# Patient Record
Sex: Female | Born: 1995 | Hispanic: Yes | Marital: Single | Smoking: Current every day smoker
Health system: Southern US, Community
[De-identification: ages and names within clinical notes are randomized; demographics above are authoritative.]

---
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
## 2015-01-19 ENCOUNTER — Inpatient Hospital Stay (HOSPITAL_COMMUNITY)
Admission: AD | Admit: 2015-01-19 | Discharge: 2015-01-19 | Discharge status: Against Medical Advice | Payer: Medicaid Other | Source: Ambulatory Visit | Attending: Obstetrics & Gynecology | Admitting: Obstetrics & Gynecology

## 2015-01-19 DIAGNOSIS — Z5320 Procedure and treatment not carried out because of patient's decision for unspecified reasons: Secondary | ICD-10-CM | POA: Diagnosis not present

## 2015-01-19 DIAGNOSIS — R109 Unspecified abdominal pain: Secondary | ICD-10-CM | POA: Diagnosis present

## 2015-01-19 LAB — URINALYSIS, ROUTINE W REFLEX MICROSCOPIC
Bilirubin Urine: NEGATIVE
Glucose, UA: NEGATIVE mg/dL
HGB URINE DIPSTICK: NEGATIVE
Ketones, ur: NEGATIVE mg/dL
Leukocytes, UA: NEGATIVE
Nitrite: NEGATIVE
PH: 6 (ref 5.0–8.0)
Protein, ur: NEGATIVE mg/dL
Urobilinogen, UA: 1 mg/dL (ref 0.0–1.0)

## 2015-01-19 NOTE — MAU Note (Signed)
Not in lobby

## 2015-01-19 NOTE — MAU Note (Signed)
Patient presents with possible pregnancy and c/o abdominal pain X 2 weeks. Denies bleeding or discharge.

## 2015-01-20 ENCOUNTER — Emergency Department (HOSPITAL_BASED_OUTPATIENT_CLINIC_OR_DEPARTMENT_OTHER)
Admission: EM | Admit: 2015-01-20 | Discharge: 2015-01-21 | Disposition: A | Discharge status: Home / Self Care | Payer: Medicaid Other | Attending: Emergency Medicine | Admitting: Emergency Medicine

## 2015-01-20 ENCOUNTER — Encounter (HOSPITAL_BASED_OUTPATIENT_CLINIC_OR_DEPARTMENT_OTHER): Payer: Self-pay | Admitting: *Deleted

## 2015-01-20 DIAGNOSIS — R109 Unspecified abdominal pain: Secondary | ICD-10-CM

## 2015-01-20 DIAGNOSIS — Z3A13 13 weeks gestation of pregnancy: Secondary | ICD-10-CM | POA: Insufficient documentation

## 2015-01-20 DIAGNOSIS — O26899 Other specified pregnancy related conditions, unspecified trimester: Secondary | ICD-10-CM

## 2015-01-20 DIAGNOSIS — O9989 Other specified diseases and conditions complicating pregnancy, childbirth and the puerperium: Secondary | ICD-10-CM | POA: Diagnosis present

## 2015-01-20 DIAGNOSIS — Z3491 Encounter for supervision of normal pregnancy, unspecified, first trimester: Secondary | ICD-10-CM

## 2015-01-20 DIAGNOSIS — O2341 Unspecified infection of urinary tract in pregnancy, first trimester: Secondary | ICD-10-CM | POA: Insufficient documentation

## 2015-01-20 LAB — URINALYSIS, ROUTINE W REFLEX MICROSCOPIC
Bilirubin Urine: NEGATIVE
Glucose, UA: NEGATIVE mg/dL
Hgb urine dipstick: NEGATIVE
Ketones, ur: NEGATIVE mg/dL
Nitrite: NEGATIVE
PH: 6.5 (ref 5.0–8.0)
Protein, ur: NEGATIVE mg/dL
SPECIFIC GRAVITY, URINE: 1.03 (ref 1.005–1.030)
UROBILINOGEN UA: 1 mg/dL (ref 0.0–1.0)

## 2015-01-20 LAB — URINE MICROSCOPIC-ADD ON

## 2015-01-20 LAB — PREGNANCY, URINE: PREG TEST UR: POSITIVE — AB

## 2015-01-20 NOTE — ED Notes (Signed)
Pt. Reports abd. Pain in the lower L quadrant that started approx. 2 wks ago.  Pain has been off and on and tonight the Pt. Reports she started vomiting 2 hours ago... Pt. Reports she vomited 1 time.  No reports of diarrhea.

## 2015-01-21 ENCOUNTER — Emergency Department (HOSPITAL_BASED_OUTPATIENT_CLINIC_OR_DEPARTMENT_OTHER): Payer: Medicaid Other

## 2015-01-21 MED ORDER — PRENATAL COMPLETE 14-0.4 MG PO TABS: 1.0000 | ORAL_TABLET | Freq: Every day | ORAL | Status: AC

## 2015-01-21 MED ORDER — NITROFURANTOIN MONOHYD MACRO 100 MG PO CAPS
100.0000 mg | ORAL_CAPSULE | Freq: Once | ORAL | Status: AC
Start: 2015-01-21 — End: 2015-01-21
  Administered 2015-01-21: 100 mg via ORAL
  Filled 2015-01-21: qty 1

## 2015-01-21 MED ORDER — NITROFURANTOIN MONOHYD MACRO 100 MG PO CAPS: 100.0000 mg | ORAL_CAPSULE | Freq: Two times a day (BID) | ORAL | Status: AC

## 2015-01-21 NOTE — ED Provider Notes (Signed)
CSN: [id number]     Arrival date & time 01/20/15  2321 History   First MD Initiated Contact with Patient 01/21/15 0123     Chief Complaint  Patient presents with  . Abdominal Pain     (Consider location/radiation/quality/duration/timing/severity/associated sxs/prior Treatment) HPI  This is an 19 year old female who has had a positive pregnancy test at home but does not know how far along she is. She is here with a two-week history of left suprapubic pain. The pain is intermittent and sharp. It is mild to moderate at its worst. There is no specific mitigating or exacerbating factor. She denies fever, chills, nausea, vomiting except for one instance, diarrhea, vaginal bleeding or discharge, dysuria or hematuria.  History reviewed. No pertinent past medical history. History reviewed. No pertinent past surgical history. No family history on file. History  Substance Use Topics  . Smoking status: Never Smoker   . Smokeless tobacco: Not on file  . Alcohol Use: No   OB History    No data available     Review of Systems  All other systems reviewed and are negative.   Allergies  Review of patient's allergies indicates no known allergies.  Home Medications   Prior to Admission medications   Not on File   BP 102/58 mmHg  Pulse 75  Temp(Src) 98.5 F (36.9 C) (Oral)  Resp 18  Ht  (1.626 m)  Wt 117 lb (53.071 kg)  BMI 20.07 kg/m2  SpO2 100%  LMP 11/22/2014   Physical Exam  General: Well-developed, well-nourished female in no acute distress; appearance consistent with age of record HENT: normocephalic; atraumatic Eyes: pupils equal, round and reactive to light; extraocular muscles intact Neck: supple Heart: regular rate and rhythm Lungs: clear to auscultation bilaterally Abdomen: soft; nondistended; nontender; no masses or hepatosplenomegaly; bowel sounds present Extremities: No deformity; full range of motion; pulses normal Neurologic: Awake, alert and oriented; motor  function intact in all extremities and symmetric; no facial droop Skin: Warm and dry Psychiatric: Normal mood and affect    ED Course  Procedures (including critical care time)   MDM   Nursing notes and vitals signs, including pulse oximetry, reviewed.  Summary of this visit's results, reviewed by myself:  Labs:  Results for orders placed or performed during the hospital encounter of 01/20/15 (from the past 24 hour(s))  Urinalysis, Routine w reflex microscopic (not at [facility])     Status: Abnormal   Collection Time: 01/20/15 11:25 PM  Result Value Ref Range   Color, Urine YELLOW YELLOW   APPearance CLOUDY (A) CLEAR   Specific Gravity, Urine 1.030 1.005 - 1.030   pH 6.5 5.0 - 8.0   Glucose, UA NEGATIVE NEGATIVE mg/dL   Hgb urine dipstick NEGATIVE NEGATIVE   Bilirubin Urine NEGATIVE NEGATIVE   Ketones, ur NEGATIVE NEGATIVE mg/dL   Protein, ur NEGATIVE NEGATIVE mg/dL   Urobilinogen, UA 1.0 0.0 - 1.0 mg/dL   Nitrite NEGATIVE NEGATIVE   Leukocytes, UA MODERATE (A) NEGATIVE  Pregnancy, urine     Status: Abnormal   Collection Time: 01/20/15 11:25 PM  Result Value Ref Range   Preg Test, Ur POSITIVE (A) NEGATIVE  Urine microscopic-add on     Status: Abnormal   Collection Time: 01/20/15 11:25 PM  Result Value Ref Range   Squamous Epithelial / LPF MANY (A) RARE   WBC, UA 7-10 <3 WBC/hpf   RBC / HPF 0-2 <3 RBC/hpf   Bacteria, UA MANY (A) RARE   Urine-Other MUCOUS PRESENT  Imaging Studies: [place] Ob Comp Less 14 Wks  01/21/2015   CLINICAL DATA:  Left lower quadrant pain and nausea. Estimated gestational age by LMP is 8 weeks 4 days. Quantitative beta HCG is not obtained.  EXAM: OBSTETRIC <14 WK ULTRASOUND  TECHNIQUE: Transabdominal ultrasound was performed for evaluation of the gestation as well as the maternal uterus and adnexal regions.  COMPARISON:  None.  FINDINGS: Intrauterine gestational sac: A single intrauterine pregnancy is identified.  Yolk sac: Yolk sac is not visualized  consistent with gestational age.  Embryo:  Fetal pole is identified.  Cardiac Activity: Fetal cardiac activity is observed.  Heart Rate: 162 bpm  CRL:   39  mm   10 w 6 d  Biparietal diameter:  19 mm 12 w 6 d  US EDC based on combined measurements: 11 weeks 6 days  Maternal uterus/adnexae: No myometrial mass lesions identified. No subchorionic hemorrhage. Both ovaries are identified and appear normal. No abnormal adnexal masses. No free pelvic fluid.  IMPRESSION: A single intrauterine pregnancy is identified. Based on combination of crown-rump length and biparietal diameter, estimated gestational age by ultrasound is 11 weeks 6 days. No acute complication is suggested.   Electronically Signed   By: Burman NievesWilliam  Stevens M.D.   On: 01/21/2015 01:16      Paula LibraJohn Gustaf Mccarter, MD 01/21/15 0127

## 2015-01-21 NOTE — ED Notes (Signed)
C/o abd pain off and on x 2 weeks,  Vomited x 1  Denies burning w urination,  No dc,  No diarrhea

## 2015-01-22 LAB — URINE CULTURE

## 2016-02-01 IMAGING — US US OB COMP LESS 14 WK
1 series · 14 of 14 positions shown · non-contrast
Comparison: None.

CLINICAL DATA: Left lower quadrant pain and nausea. Estimated
gestational age by LMP is 8 weeks 4 days. Quantitative beta HCG is
not obtained.

EXAM:
OBSTETRIC <14 WK ULTRASOUND
TECHNIQUE: Transabdominal ultrasound was performed for evaluation of the
gestation as well as the maternal uterus and adnexal regions.

[Series 1: us ob comp less 14 wk · 0.18mm/px · 14 acquisitions, 14 frames shown]
[im 1/14]
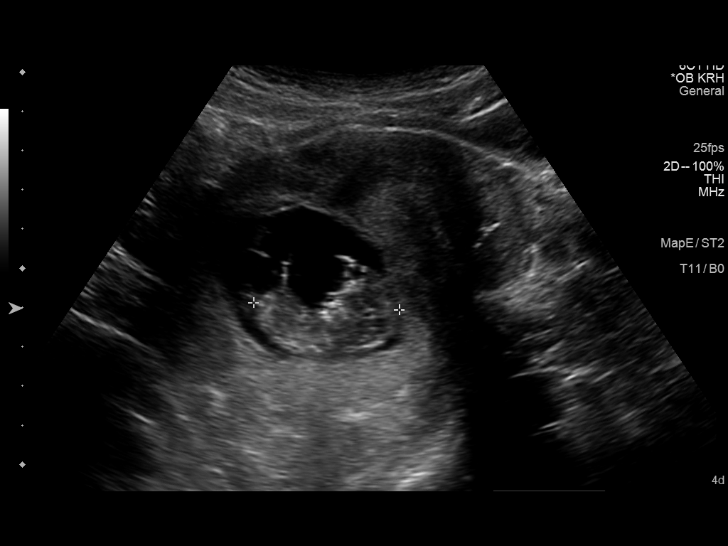
[im 2/14]
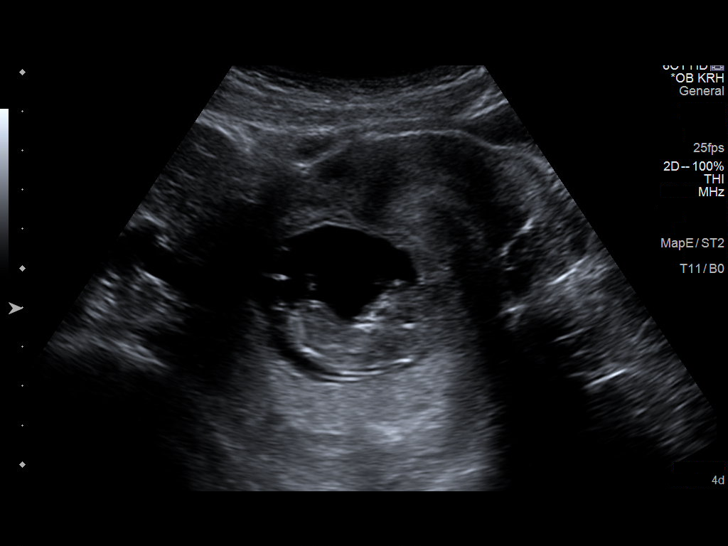
[im 3/14]
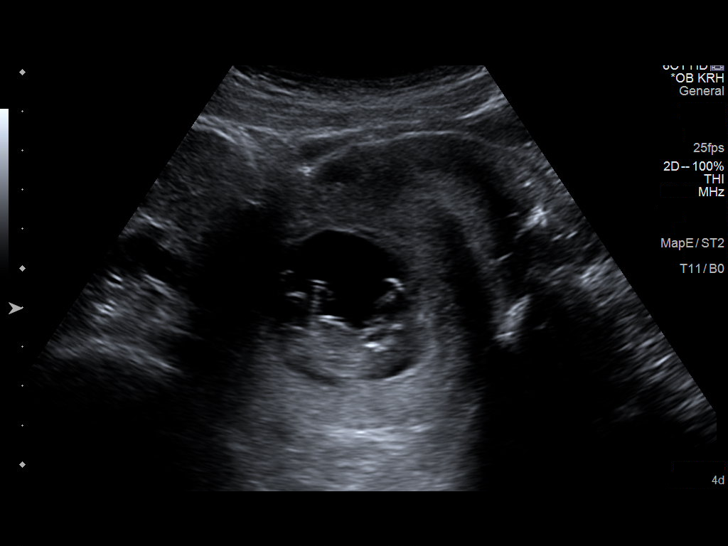
[im 4/14]
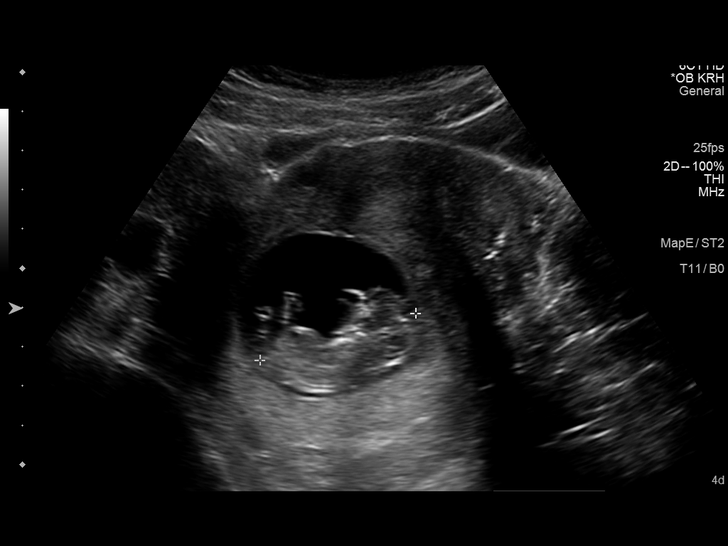
[im 5/14]
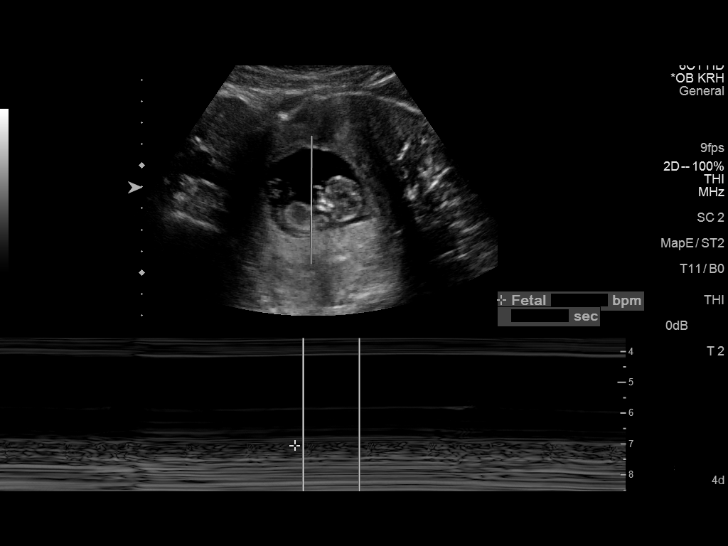
[im 6/14]
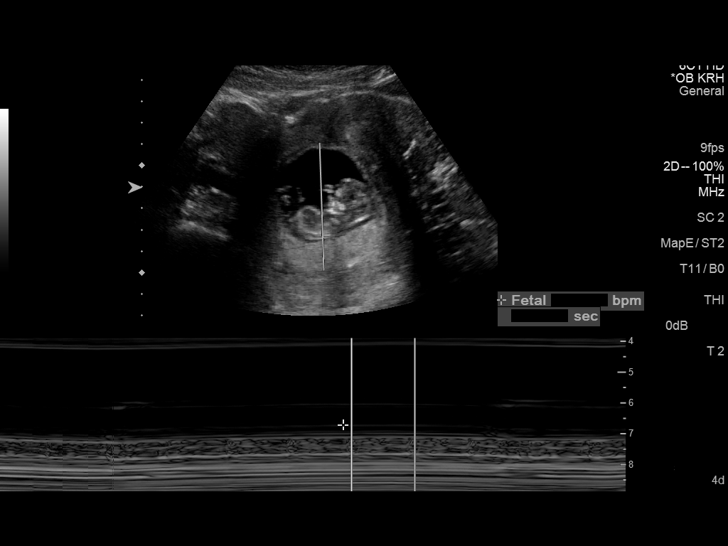
[im 7/14]
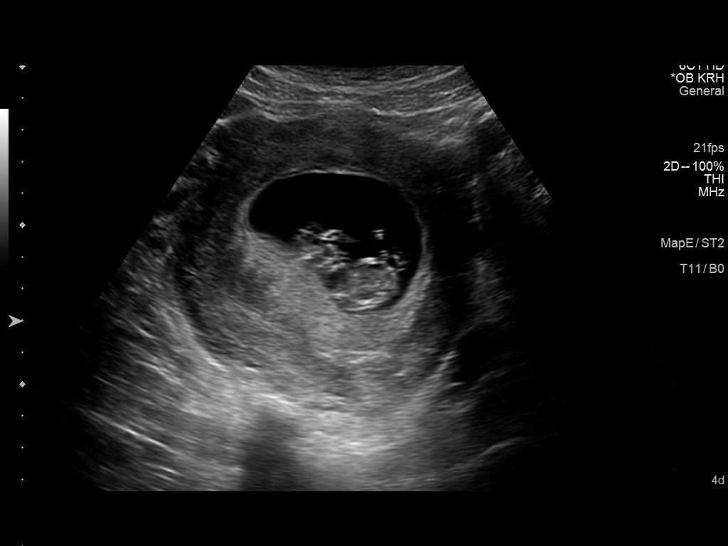
[im 8/14]
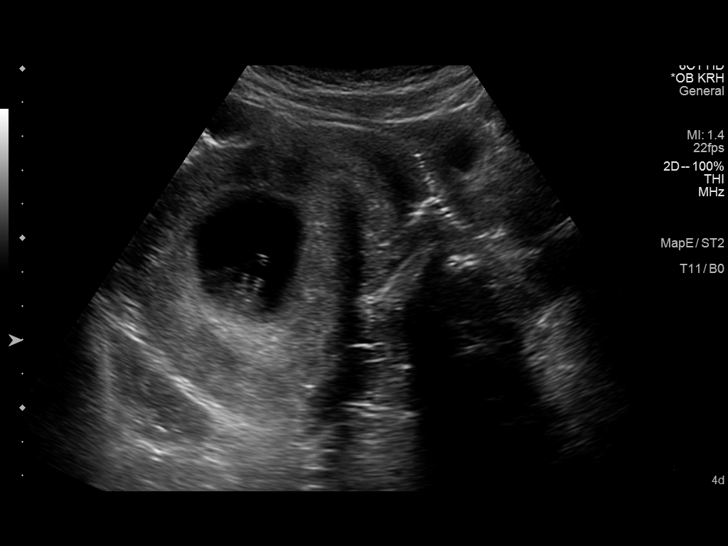
[im 9/14]
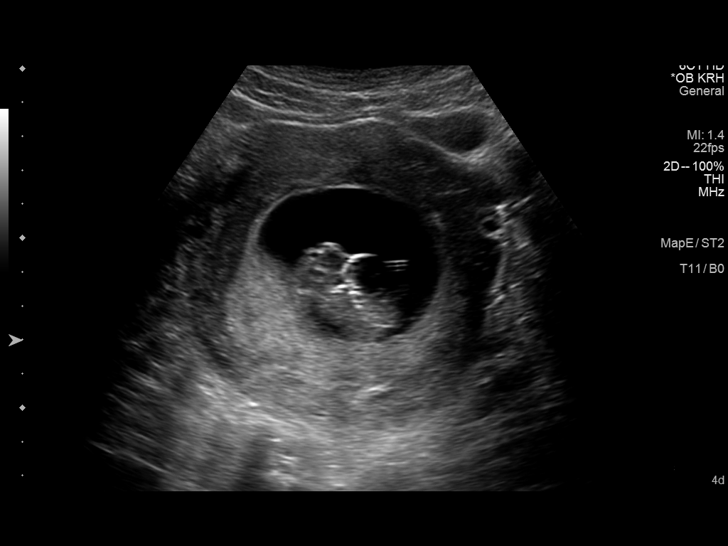
[im 10/14]
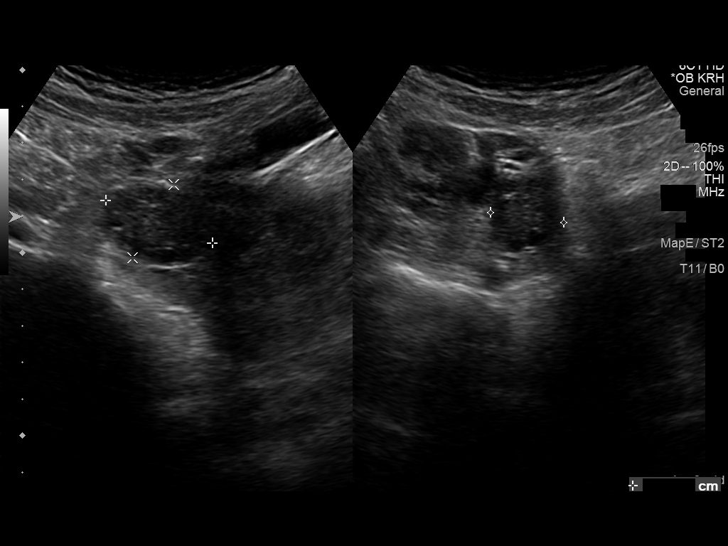
[im 11/14]
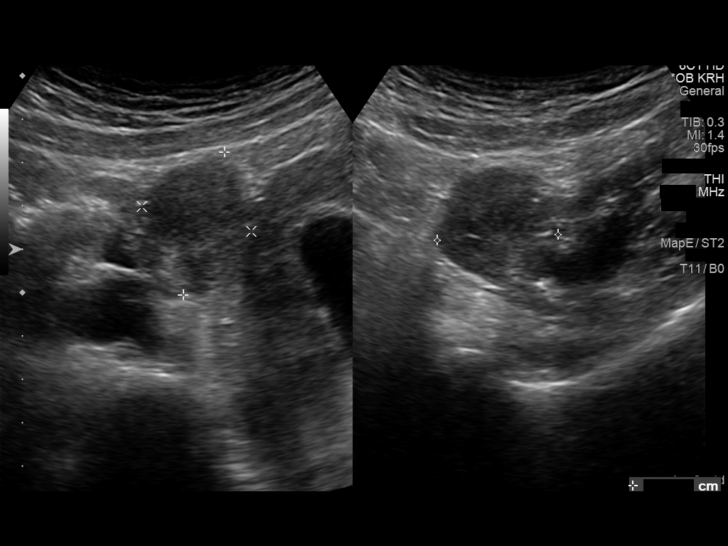
[im 12/14]
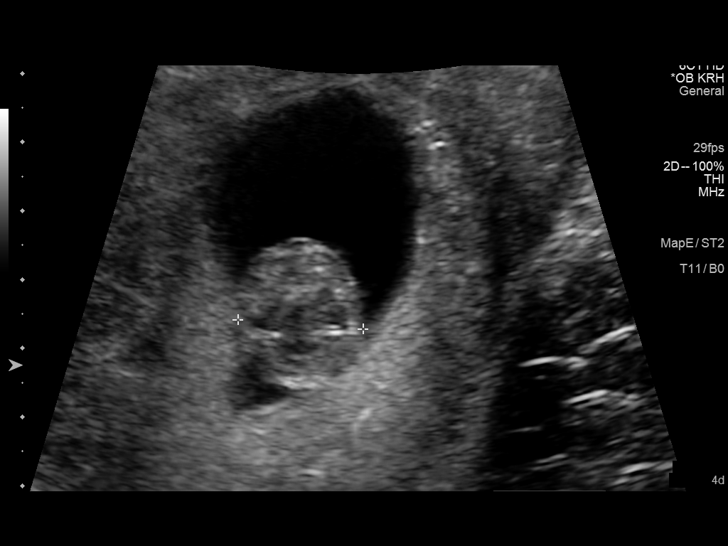
[im 13/14]
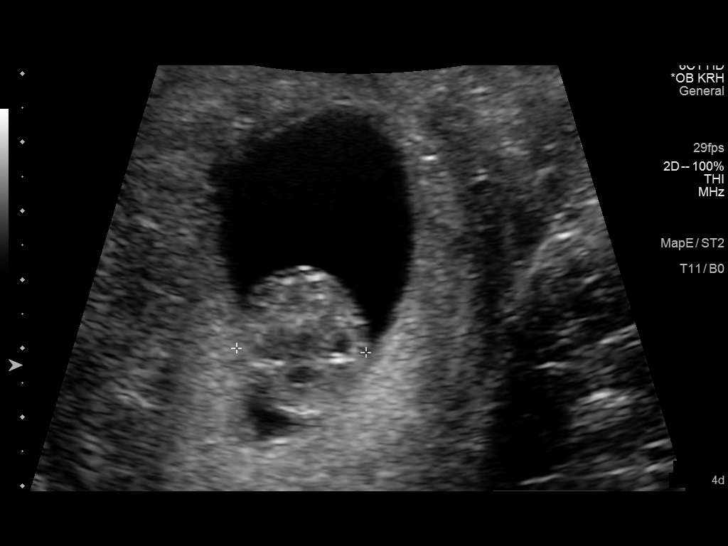
[im 14/14]
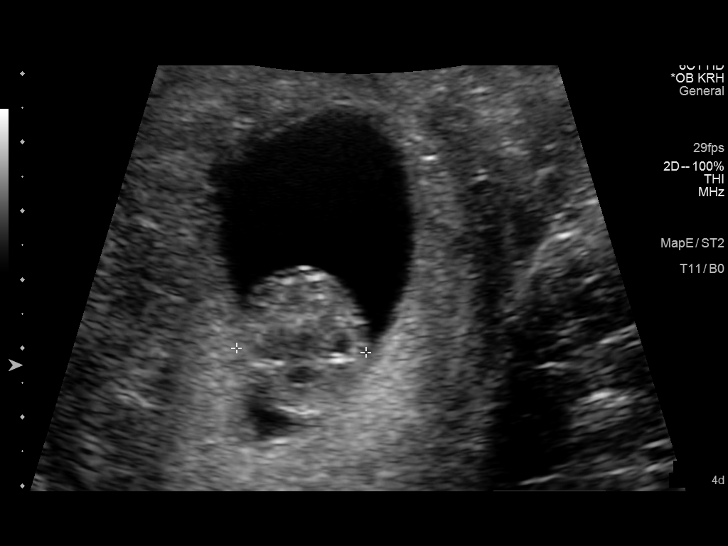

[14 of 14 positions shown; findings below may reference images not displayed]

FINDINGS: Intrauterine gestational sac: A single intrauterine pregnancy is
identified.

Yolk sac: Yolk sac is not visualized consistent with gestational
age.

Embryo:  Fetal pole is identified.

Cardiac Activity: Fetal cardiac activity is observed.

Heart Rate: 162 bpm

CRL:   39  mm   10 w 6 d

Biparietal diameter:  19 mm 12 w 6 d

US EDC based on combined measurements: 11 weeks 6 days

Maternal uterus/adnexae: No myometrial mass lesions identified. No
subchorionic hemorrhage. Both ovaries are identified and appear
normal. No abnormal adnexal masses. No free pelvic fluid.
IMPRESSION: A single intrauterine pregnancy is identified. Based on combination
of crown-rump length and biparietal diameter, estimated gestational
age by ultrasound is 11 weeks 6 days. No acute complication is
suggested.

## 2016-08-20 ENCOUNTER — Emergency Department (HOSPITAL_BASED_OUTPATIENT_CLINIC_OR_DEPARTMENT_OTHER)
Admission: EM | Admit: 2016-08-20 | Discharge: 2016-08-20 | Disposition: A | Discharge status: Home / Self Care | Payer: Medicaid Other | Attending: Dermatology | Admitting: Dermatology

## 2016-08-20 ENCOUNTER — Encounter (HOSPITAL_BASED_OUTPATIENT_CLINIC_OR_DEPARTMENT_OTHER): Payer: Self-pay | Admitting: *Deleted

## 2016-08-20 DIAGNOSIS — M545 Low back pain: Secondary | ICD-10-CM | POA: Insufficient documentation

## 2016-08-20 DIAGNOSIS — R1110 Vomiting, unspecified: Secondary | ICD-10-CM | POA: Insufficient documentation

## 2016-08-20 DIAGNOSIS — Z5321 Procedure and treatment not carried out due to patient leaving prior to being seen by health care provider: Secondary | ICD-10-CM | POA: Diagnosis not present

## 2016-08-20 DIAGNOSIS — Z79899 Other long term (current) drug therapy: Secondary | ICD-10-CM | POA: Insufficient documentation

## 2016-08-20 LAB — URINALYSIS, ROUTINE W REFLEX MICROSCOPIC
BILIRUBIN URINE: NEGATIVE
GLUCOSE, UA: NEGATIVE mg/dL
Ketones, ur: 15 mg/dL — AB
Nitrite: NEGATIVE
Protein, ur: 100 mg/dL — AB
SPECIFIC GRAVITY, URINE: 1.037 — AB (ref 1.005–1.030)
pH: 6 (ref 5.0–8.0)

## 2016-08-20 LAB — URINALYSIS, MICROSCOPIC (REFLEX)

## 2016-08-20 LAB — PREGNANCY, URINE: Preg Test, Ur: NEGATIVE

## 2016-08-20 MED ORDER — ONDANSETRON 4 MG PO TBDP
4.0000 mg | ORAL_TABLET | Freq: Once | ORAL | Status: AC
  Administered 2016-08-20: 4 mg via ORAL
  Filled 2016-08-20: qty 1

## 2016-08-20 NOTE — ED Notes (Signed)
Pt wandering how long it was going to be before the doctor comes in. Pt made aware that it's going to be awhile.

## 2016-08-20 NOTE — ED Notes (Signed)
Pt was triaged by this RN.

## 2016-08-20 NOTE — ED Triage Notes (Signed)
Pt states she ate [employer] at 2200, then around MN she began vomiting. Vomited x 3 since. Also c/o low back pain. Denies other s/s.

## 2016-09-02 ENCOUNTER — Emergency Department (HOSPITAL_BASED_OUTPATIENT_CLINIC_OR_DEPARTMENT_OTHER)
Admission: EM | Admit: 2016-09-02 | Discharge: 2016-09-02 | Disposition: A | Discharge status: Home / Self Care | Payer: Medicaid Other | Attending: Emergency Medicine | Admitting: Emergency Medicine

## 2016-09-02 ENCOUNTER — Encounter (HOSPITAL_BASED_OUTPATIENT_CLINIC_OR_DEPARTMENT_OTHER): Payer: Self-pay | Admitting: Emergency Medicine

## 2016-09-02 DIAGNOSIS — R1032 Left lower quadrant pain: Secondary | ICD-10-CM | POA: Insufficient documentation

## 2016-09-02 DIAGNOSIS — R112 Nausea with vomiting, unspecified: Secondary | ICD-10-CM

## 2016-09-02 DIAGNOSIS — R1031 Right lower quadrant pain: Secondary | ICD-10-CM | POA: Diagnosis present

## 2016-09-02 LAB — URINALYSIS, ROUTINE W REFLEX MICROSCOPIC
Bilirubin Urine: NEGATIVE
GLUCOSE, UA: NEGATIVE mg/dL
KETONES UR: NEGATIVE mg/dL
Nitrite: NEGATIVE
Protein, ur: NEGATIVE mg/dL
Specific Gravity, Urine: 1.024 (ref 1.005–1.030)
pH: 6 (ref 5.0–8.0)

## 2016-09-02 LAB — URINALYSIS, MICROSCOPIC (REFLEX)

## 2016-09-02 LAB — PREGNANCY, URINE: PREG TEST UR: NEGATIVE

## 2016-09-02 MED ORDER — ONDANSETRON 4 MG PO TBDP: 4.0000 mg | ORAL_TABLET | Freq: Once | ORAL | Status: DC

## 2016-09-02 MED ORDER — DICYCLOMINE HCL 20 MG PO TABS: 20.0000 mg | ORAL_TABLET | Freq: Three times a day (TID) | ORAL | 0 refills | Status: AC | PRN

## 2016-09-02 MED ORDER — ONDANSETRON 4 MG PO TBDP: 4.0000 mg | ORAL_TABLET | Freq: Three times a day (TID) | ORAL | 0 refills | Status: AC | PRN

## 2016-09-02 NOTE — ED Provider Notes (Signed)
By signing my name below, I, Modena Jansky, attest that this documentation has been prepared under the direction and in the presence of Deetta Siegmann N Janah Mcculloh, DO. Electronically Signed: Modena Jansky, Scribe. 09/02/2016. 11:15 PM.  TIME SEEN: 11:15 PM  CHIEF COMPLAINT: Abdominal pain  HPI:  HPI Comments: Michele Barber is a 21 y.o. female who presents to the Emergency Department complaining of constant moderate lower abdominal pain that started today. She states her pain resolved after her 3rd episode of associated vomiting. She describes the pain before as a cramping sensation. She is currently on her menstrual cycle. She denies any hx of abdominal surgeries, recent international travel, diarrhea, fever, chills, blood in stool, melena, dysuria, hematuria, or vaginal bleeding/discharge. Reports she is feeling much better.   ROS: See HPI Constitutional: no fever  Eyes: no drainage  ENT: no runny nose   Cardiovascular:  no chest pain  Resp: no SOB  GI: +vomiting +abd pain GU: no dysuria Integumentary: no rash  Allergy: no hives  Musculoskeletal: no leg swelling  Neurological: no slurred speech ROS otherwise negative  PAST MEDICAL HISTORY/PAST SURGICAL HISTORY:  History reviewed. No pertinent past medical history.  MEDICATIONS:  Prior to Admission medications   Medication Sig Start Date End Date Taking? Authorizing Provider  nitrofurantoin, macrocrystal-monohydrate, (MACROBID) 100 MG capsule Take 1 capsule (100 mg total) by mouth 2 (two) times daily. X 7 days 01/21/15   Paula Libra, MD  Prenatal Vit-Fe Fumarate-FA (PRENATAL COMPLETE) 14-0.4 MG TABS Take 1 tablet by mouth daily. 01/21/15   Paula Libra, MD    ALLERGIES:  No Known Allergies  SOCIAL HISTORY:  Social History  Substance Use Topics  . Smoking status: Never Smoker  . Smokeless tobacco: Never Used  . Alcohol use No    FAMILY HISTORY: History reviewed. No pertinent family history.  EXAM: BP 106/60 (BP Location: Left Arm)    Pulse 75   Temp 98.4 F (36.9 C) (Oral)   Resp 18   Ht 5\' 3"  (1.6 m)   Wt 115 lb (52.2 kg)   LMP 08/20/2016 (Exact Date)   SpO2 100%   BMI 20.37 kg/m  CONSTITUTIONAL: Alert and oriented and responds appropriately to questions. Well-appearing; well-nourished and afebrile, nontoxic, smiling, well-hydrated HEAD: Normocephalic EYES: Conjunctivae clear, PERRL, EOMI ENT: normal nose; no rhinorrhea; moist mucous membranes NECK: Supple, no meningismus, no nuchal rigidity, no LAD  CARD: RRR; S1 and S2 appreciated; no murmurs, no clicks, no rubs, no gallops RESP: Normal chest excursion without splinting or tachypnea; breath sounds clear and equal bilaterally; no wheezes, no rhonchi, no rales, no hypoxia or respiratory distress, speaking full sentences ABD/GI: Normal bowel sounds; non-distended; soft, non-tender, no rebound, no guarding, no peritoneal signs, no hepatosplenomegaly, no tenderness at McBurney's point BACK:  The back appears normal and is non-tender to palpation, there is no CVA tenderness EXT: Normal ROM in all joints; non-tender to palpation; no edema; normal capillary refill; no cyanosis, no calf tenderness or swelling    SKIN: Normal color for age and race; warm; no rash NEURO: Moves all extremities equally, normal gait PSYCH: The patient's mood and manner are appropriate. Grooming and personal hygiene are appropriate.  MEDICAL DECISION MAKING: Patient here with likely viral gastroenteritis. Had a crampy abdominal sensation diffusely throughout her abdomen prior to vomiting 3 times. No fevers or diarrhea. Abdominal pain is now completely gone. Her bowel exam is benign. She appears well-hydrated and is nontoxic. We'll treat with Zofran but feel she can be discharged home. We'll give  prescriptions for Bentyl, Zofran. Discussed with her that she may develop fever, diarrhea have recommended ibuprofen, Tylenol, Imodium as needed. Discussed return precautions. Urine shows blood but again  she is on her menstrual cycle without urinary symptoms. Pregnancy test is negative. She is comfortable with this plan. I do not feel she has cholecystitis, appendicitis, colitis, diverticulitis, bowel obstruction given her very benign abdominal exam. I do not feel she needs labs or emergent imaging.  At this time, I do not feel there is any life-threatening condition present. I have reviewed and discussed all results (EKG, imaging, lab, urine as appropriate) and exam findings with patient/family. I have reviewed nursing notes and appropriate previous records.  I feel the patient is safe to be discharged home without further emergent workup and can continue workup as an outpatient as needed. Discussed usual and customary return precautions. Patient/family verbalize understanding and are comfortable with this plan.  Outpatient follow-up has been provided. All questions have been answered.   I personally performed the services described in this documentation, which was scribed in my presence. The recorded information has been reviewed and is accurate.     Layla MawKristen N Armoni Depass, DO 09/03/16 [phone]

## 2016-09-02 NOTE — ED Triage Notes (Signed)
Pt reports bilateral lower abd pain and emesis x2 starting today, no diarrhea or urinary symptoms.  Pt alert and oriented.

## 2016-09-02 NOTE — Discharge Instructions (Signed)
If you develop fever, you may use Tylenol 1000 mg every 6 hours and alternate this with ibuprofen 800 mg every 8 hours. If you develop diarrhea, you may use over-the-counter Imodium. Please eat a bland diet for the next 2-3 days. I suspect this is likely a viral illness causing your symptoms. If you began having worsening, severe abdominal pain that is persistent, vomiting and cannot stop, blood in your stool, please return to the hospital.   To find a primary care or specialty doctor please call [phone] or [phone] to access "[place] Find a Doctor Service."  You may also go on the [facility] website at [email or web address]  There are also multiple [facility], [facility], [facility] practices throughout the [place] that are frequently accepting new patients. You may find a clinic that is close to your home and contact them.  [facility] -  [address] [place] [phone]   [facility] -  [address] [place] [id number] [phone]   [facility] [phone]  [place] [phone]

## 2016-09-02 NOTE — ED Notes (Signed)
Pt states she "feels fine now" and is asking to go home. RN encouraged patient to stay and be evaluated by provider.

## 2016-09-02 NOTE — ED Notes (Signed)
Water given for fluid challenge  ?

## 2016-09-02 NOTE — ED Notes (Signed)
Pt given d/c instructions as per chart. Rx x 2. Verbalizes understanding. No questions. 

## 2016-09-12 ENCOUNTER — Telehealth (HOSPITAL_BASED_OUTPATIENT_CLINIC_OR_DEPARTMENT_OTHER): Payer: Self-pay

## 2016-09-12 NOTE — Telephone Encounter (Signed)
Pt requested copy of RTW note from 09/02/16 ED visit-states employer requires "no restrictions" on note-copy of note from ED chart with no restrictions option selected given to pt

## 2017-02-18 ENCOUNTER — Encounter (HOSPITAL_BASED_OUTPATIENT_CLINIC_OR_DEPARTMENT_OTHER): Payer: Self-pay | Admitting: *Deleted

## 2017-02-18 ENCOUNTER — Emergency Department (HOSPITAL_BASED_OUTPATIENT_CLINIC_OR_DEPARTMENT_OTHER)
Admission: EM | Admit: 2017-02-18 | Discharge: 2017-02-18 | Disposition: A | Discharge status: Home / Self Care | Payer: Medicaid Other | Attending: Emergency Medicine | Admitting: Emergency Medicine

## 2017-02-18 DIAGNOSIS — Z113 Encounter for screening for infections with a predominantly sexual mode of transmission: Secondary | ICD-10-CM | POA: Diagnosis not present

## 2017-02-18 DIAGNOSIS — N760 Acute vaginitis: Secondary | ICD-10-CM | POA: Diagnosis not present

## 2017-02-18 DIAGNOSIS — N898 Other specified noninflammatory disorders of vagina: Secondary | ICD-10-CM | POA: Diagnosis present

## 2017-02-18 DIAGNOSIS — Z202 Contact with and (suspected) exposure to infections with a predominantly sexual mode of transmission: Secondary | ICD-10-CM | POA: Insufficient documentation

## 2017-02-18 DIAGNOSIS — B9689 Other specified bacterial agents as the cause of diseases classified elsewhere: Secondary | ICD-10-CM

## 2017-02-18 LAB — WET PREP, GENITAL
Sperm: NONE SEEN
TRICH WET PREP: NONE SEEN
YEAST WET PREP: NONE SEEN

## 2017-02-18 LAB — URINALYSIS, ROUTINE W REFLEX MICROSCOPIC
Bilirubin Urine: NEGATIVE
GLUCOSE, UA: NEGATIVE mg/dL
Hgb urine dipstick: NEGATIVE
KETONES UR: 15 mg/dL — AB
Leukocytes, UA: NEGATIVE
NITRITE: NEGATIVE
Protein, ur: NEGATIVE mg/dL
Specific Gravity, Urine: 1.024 (ref 1.005–1.030)
pH: 6.5 (ref 5.0–8.0)

## 2017-02-18 LAB — PREGNANCY, URINE: PREG TEST UR: NEGATIVE

## 2017-02-18 MED ORDER — AZITHROMYCIN 250 MG PO TABS
1000.0000 mg | ORAL_TABLET | Freq: Once | ORAL | Status: AC
  Administered 2017-02-18: 1000 mg via ORAL
  Filled 2017-02-18: qty 4

## 2017-02-18 MED ORDER — LIDOCAINE HCL 1 % IJ SOLN
INTRAMUSCULAR | Status: AC
  Administered 2017-02-18: 10 mL
  Filled 2017-02-18: qty 10

## 2017-02-18 MED ORDER — CEFTRIAXONE SODIUM 250 MG IJ SOLR
250.0000 mg | Freq: Once | INTRAMUSCULAR | Status: AC
  Administered 2017-02-18: 250 mg via INTRAMUSCULAR
  Filled 2017-02-18: qty 250

## 2017-02-18 MED ORDER — METRONIDAZOLE 500 MG PO TABS: 500.0000 mg | ORAL_TABLET | Freq: Two times a day (BID) | ORAL | 0 refills | Status: AC

## 2017-02-18 NOTE — ED Notes (Signed)
Patient denies pain and is resting comfortably.  

## 2017-02-18 NOTE — Discharge Instructions (Signed)
You been treated today for gonorrhea and chlamydia. We have taken samples to test for these diseases. If the samples are positive, you will be notified. You do not need to seek treatment as you have been treated today.  Take antibiotics as directed. Please take all of your antibiotics until finished.  Take Flagyl as directed.  It is very important that you do not consume any alcohol while taking this medication as it will cause you to become violently ill.  Do not have sexual intercourse for the next 7 days.  Follow-up with the referred wellness clinic for primary care evaluation. You can also follow up with her OB/GYN for further evaluation.  Return the emergency Department for any worsening pain, vaginal discharge, vaginal bleeding, fever, abdominal pain, difficulty urinating, blood in urine or any other worsening or concerning symptoms.

## 2017-02-18 NOTE — ED Triage Notes (Signed)
States she was exposed to an STD and wants to be checked. No symptoms.

## 2017-02-18 NOTE — ED Provider Notes (Signed)
[facility] Provider Note   CSN: [id number] Arrival date & time: 02/18/17  1337     History   Chief Complaint No chief complaint on file.   HPI Michele Barber is a 21 y.o. female who presents emergency Department wanting evaluation for possible STD exposure. Patient states that she had sexual intercourse with a partner on 02/14/17. She states that she was contacted by her partner stating that he was having some complaints of burning and was going to be evaluated and treated for STDs. Patient states that they did not use protection with sexual intercourse. Patient has a history of chlamydia that was successfully treated several years ago. No complications. Patient denies any fevers, vaginal discharge, dysuria, hematuria, abdominal pain, vaginal bleeding.  The history is provided by the patient.    History reviewed. No pertinent past medical history.  There are no active problems to display for this patient.   History reviewed. No pertinent surgical history.  OB History    No data available       Home Medications    Prior to Admission medications   Medication Sig Start Date End Date Taking? Authorizing Provider  dicyclomine (BENTYL) 20 MG tablet Take 1 tablet (20 mg total) by mouth every 8 (eight) hours as needed for spasms. 09/02/16   Ward, Layla MawKristen N, DO  metroNIDAZOLE (FLAGYL) 500 MG tablet Take 1 tablet (500 mg total) by mouth 2 (two) times daily. 02/18/17   Maxwell CaulLayden, Buel Molder A, PA-C  nitrofurantoin, macrocrystal-monohydrate, (MACROBID) 100 MG capsule Take 1 capsule (100 mg total) by mouth 2 (two) times daily. X 7 days 01/21/15   Molpus, John, MD  ondansetron (ZOFRAN ODT) 4 MG disintegrating tablet Take 1 tablet (4 mg total) by mouth every 8 (eight) hours as needed for nausea or vomiting. 09/02/16   Ward, Layla MawKristen N, DO  Prenatal Vit-Fe Fumarate-FA (PRENATAL COMPLETE) 14-0.4 MG TABS Take 1 tablet by mouth daily. 01/21/15   Molpus, John, MD    Family History No family  history on file.  Social History Social History  Substance Use Topics  . Smoking status: Never Smoker  . Smokeless tobacco: Never Used  . Alcohol use No     Allergies   Patient has no known allergies.   Review of Systems Review of Systems  Constitutional: Negative for fever.  Respiratory: Negative for cough and shortness of breath.   Cardiovascular: Negative for chest pain.  Gastrointestinal: Negative for abdominal pain and vomiting.  Genitourinary: Negative for dysuria, hematuria, vaginal bleeding and vaginal discharge.  Skin: Negative for rash.  Neurological: Negative for headaches.     Physical Exam Updated Vital Signs BP 118/78 (BP Location: Right Arm)   Pulse 61   Temp 98.5 F (36.9 C) (Oral)   Resp 17   Ht 5\' 3"  (1.6 m)   Wt 51.7 kg (114 lb)   LMP 01/20/2017   SpO2 100%   BMI 20.19 kg/m   Physical Exam  Constitutional: She appears well-developed and well-nourished.  Sitting comfortably on examination table  HENT:  Head: Normocephalic and atraumatic.  Eyes: Conjunctivae and EOM are normal. Right eye exhibits no discharge. Left eye exhibits no discharge. No scleral icterus.  Pulmonary/Chest: Effort normal.  Abdominal: Soft. Normal appearance. She exhibits no distension. There is no tenderness.  Genitourinary: Uterus normal. Cervix exhibits no motion tenderness and no friability. Right adnexum displays no mass and no tenderness. Left adnexum displays no mass and no tenderness. Vaginal discharge found.  Genitourinary Comments: The exam was performed  with a chaperone present. Normal external female genitalia. No rashes, lesions or open sores. There is malodorous, white discharge noted in vaginal canal. Cervix is without erythema or friability. No CMT. No adnexal masses/tenderness bilaterally.  Neurological: She is alert.  Skin: Skin is warm and dry.  Psychiatric: She has a normal mood and affect. Her speech is normal and behavior is normal.  Nursing note and  vitals reviewed.    ED Treatments / Results  Labs (all labs ordered are listed, but only abnormal results are displayed) Labs Reviewed  WET PREP, GENITAL - Abnormal; Notable for the following:       Result Value   Clue Cells Wet Prep HPF POC PRESENT (*)    WBC, Wet Prep HPF POC MANY (*)    All other components within normal limits  URINALYSIS, ROUTINE W REFLEX MICROSCOPIC - Abnormal; Notable for the following:    Ketones, ur 15 (*)    All other components within normal limits  PREGNANCY, URINE  GC/CHLAMYDIA PROBE AMP ([place]) NOT AT [facility]    EKG  EKG Interpretation None       Radiology No results found.  Procedures Procedures (including critical care time)  Medications Ordered in ED Medications  cefTRIAXone (ROCEPHIN) injection 250 mg (250 mg Intramuscular Given 02/18/17 1559)  azithromycin (ZITHROMAX) tablet 1,000 mg (1,000 mg Oral Given 02/18/17 1558)  lidocaine (XYLOCAINE) 1 % (with pres) injection (10 mLs  Given 02/18/17 1559)     Initial Impression / Assessment and Plan / ED Course  I have reviewed the triage vital signs and the nursing notes.  Pertinent labs & imaging results that were available during my care of the patient were reviewed by me and considered in my medical decision making (see chart for details).     21 year old female who presents for concern about STD. Patient reportedly had sexual intercourse with a partner on 02/1812. She was notified by the partner today stating that he was symptomatic and was going to be treated for STDs. Patient denies any symptoms but would like to be evaluated. Patient is afebrile, non-toxic appearing, sitting comfortably on examination table. Vital signs reviewed and stable. Plan to obtain pelvic, UA, urine pregnancy.  Urine pregnancy negative. UA without any signs of acute infection. Pelvic as documented above. Pelvic exam is not concerning for PID. There was some white, malodorous discharge, concerning for BV. Wet  prep and GC/chlamydia sent. Since patient is concerned about exposure, I discussed with her regarding being treated for the GC/chlamydia in the department today. Patient wishes to be treated at this time.  Wet prep reviewed. Is positive for clue cells. Will plan to treat for BV. Discussed results with patient. Instructed patient to follow-up with the wellness Center or her OB/GYN. Discussed pelvic rest instructions and encouraged patient not to have sexual intercourse for the next 7 days. Strict return precautions discussed. Patient expresses understanding and agreement plan.  Final Clinical Impressions(s) / ED Diagnoses   Final diagnoses:  Exposure to STD  Bacterial vaginitis    New Prescriptions New Prescriptions   METRONIDAZOLE (FLAGYL) 500 MG TABLET    Take 1 tablet (500 mg total) by mouth 2 (two) times daily.     Maxwell Caul, PA-C 02/18/17 4782    Arby Barrette, MD 02/28/17 0010

## 2017-02-20 LAB — GC/CHLAMYDIA PROBE AMP (~~LOC~~) NOT AT ARMC
CHLAMYDIA, DNA PROBE: NEGATIVE
NEISSERIA GONORRHEA: POSITIVE — AB

## 2018-01-18 ENCOUNTER — Emergency Department (HOSPITAL_BASED_OUTPATIENT_CLINIC_OR_DEPARTMENT_OTHER)
Admission: EM | Admit: 2018-01-18 | Discharge: 2018-01-18 | Disposition: A | Discharge status: Home / Self Care | Payer: Self-pay | Attending: Emergency Medicine | Admitting: Emergency Medicine

## 2018-01-18 ENCOUNTER — Encounter (HOSPITAL_BASED_OUTPATIENT_CLINIC_OR_DEPARTMENT_OTHER): Payer: Self-pay | Admitting: Emergency Medicine

## 2018-01-18 ENCOUNTER — Other Ambulatory Visit: Payer: Self-pay

## 2018-01-18 DIAGNOSIS — M545 Low back pain: Secondary | ICD-10-CM | POA: Insufficient documentation

## 2018-01-18 DIAGNOSIS — G8929 Other chronic pain: Secondary | ICD-10-CM | POA: Insufficient documentation

## 2018-01-18 DIAGNOSIS — Z79899 Other long term (current) drug therapy: Secondary | ICD-10-CM | POA: Insufficient documentation

## 2018-01-18 DIAGNOSIS — F17210 Nicotine dependence, cigarettes, uncomplicated: Secondary | ICD-10-CM | POA: Insufficient documentation

## 2018-01-18 LAB — URINALYSIS, ROUTINE W REFLEX MICROSCOPIC
Bilirubin Urine: NEGATIVE
GLUCOSE, UA: NEGATIVE mg/dL
KETONES UR: NEGATIVE mg/dL
LEUKOCYTES UA: NEGATIVE
NITRITE: NEGATIVE
PH: 6 (ref 5.0–8.0)
PROTEIN: NEGATIVE mg/dL
Specific Gravity, Urine: 1.025 (ref 1.005–1.030)

## 2018-01-18 LAB — PREGNANCY, URINE: Preg Test, Ur: NEGATIVE

## 2018-01-18 LAB — URINALYSIS, MICROSCOPIC (REFLEX)

## 2018-01-18 MED ORDER — NAPROXEN 375 MG PO TABS: 375.0000 mg | ORAL_TABLET | Freq: Two times a day (BID) | ORAL | 0 refills | Status: AC

## 2018-01-18 NOTE — ED Triage Notes (Signed)
R side low back pain x 5 months. Denies injury.

## 2018-01-18 NOTE — ED Provider Notes (Signed)
[facility] EMERGENCY DEPARTMENT Provider Note   CSN: [id number] Arrival date & time: 01/18/18  1130     History   Chief Complaint Chief Complaint  Patient presents with  . Back Pain    HPI Jaileigh Corey HaroldSantana is a 22 y.o. female.  Patient is a 22 year old female who presents with back pain.  She reports a 5 to 4326-month history of pain in her right lower back.  Its nonradiating.  There is no radiation down her legs.  No numbness or weakness in her legs.  No difficulty with bowel or bladder control.  She denies any known injury.  She states it flares up from time to time.  She usually takes ibuprofen with some improvement in symptoms.  She denies any urinary symptoms.  No fevers.  No abdominal pain.  No nausea or vomiting.     History reviewed. No pertinent past medical history.  There are no active problems to display for this patient.   History reviewed. No pertinent surgical history.   OB History   None      Home Medications    Prior to Admission medications   Medication Sig Start Date End Date Taking? Authorizing Provider  dicyclomine (BENTYL) 20 MG tablet Take 1 tablet (20 mg total) by mouth every 8 (eight) hours as needed for spasms. 09/02/16   Ward, Layla MawKristen N, DO  metroNIDAZOLE (FLAGYL) 500 MG tablet Take 1 tablet (500 mg total) by mouth 2 (two) times daily. 02/18/17   Maxwell CaulLayden, Lindsey A, PA-C  naproxen (NAPROSYN) 375 MG tablet Take 1 tablet (375 mg total) by mouth 2 (two) times daily. 01/18/18   Rolan BuccoBelfi, Gedalia Mcmillon, MD  nitrofurantoin, macrocrystal-monohydrate, (MACROBID) 100 MG capsule Take 1 capsule (100 mg total) by mouth 2 (two) times daily. X 7 days 01/21/15   Molpus, John, MD  ondansetron (ZOFRAN ODT) 4 MG disintegrating tablet Take 1 tablet (4 mg total) by mouth every 8 (eight) hours as needed for nausea or vomiting. 09/02/16   Ward, Layla MawKristen N, DO  Prenatal Vit-Fe Fumarate-FA (PRENATAL COMPLETE) 14-0.4 MG TABS Take 1 tablet by mouth daily. 01/21/15   Molpus, John,  MD    Family History No family history on file.  Social History Social History   Tobacco Use  . Smoking status: Current Every Day Smoker  . Smokeless tobacco: Never Used  Substance Use Topics  . Alcohol use: No  . Drug use: Not on file     Allergies   Patient has no known allergies.   Review of Systems Review of Systems  Constitutional: Negative for chills, diaphoresis, fatigue and fever.  HENT: Negative for congestion, rhinorrhea and sneezing.   Eyes: Negative.   Respiratory: Negative for cough, chest tightness and shortness of breath.   Cardiovascular: Negative for chest pain and leg swelling.  Gastrointestinal: Negative for abdominal pain, blood in stool, diarrhea, nausea and vomiting.  Genitourinary: Negative for difficulty urinating, flank pain, frequency and hematuria.  Musculoskeletal: Positive for back pain. Negative for arthralgias.  Skin: Negative for rash.  Neurological: Negative for dizziness, speech difficulty, weakness, numbness and headaches.     Physical Exam Updated Vital Signs BP (!) 114/91 (BP Location: Right Arm)   Pulse 70   Temp 99.7 F (37.6 C) (Oral)   Resp 16   Ht 5\' 3"  (1.6 m)   Wt 49.9 kg (110 lb)   LMP 12/23/2017   SpO2 100%   BMI 19.49 kg/m   Physical Exam  Constitutional: She is oriented to person, place,  and time. She appears well-developed and well-nourished.  HENT:  Head: Normocephalic and atraumatic.  Eyes: Pupils are equal, round, and reactive to light.  Neck: Normal range of motion. Neck supple.  Cardiovascular: Normal rate, regular rhythm and normal heart sounds.  Pulmonary/Chest: Effort normal and breath sounds normal. No respiratory distress. She has no wheezes. She has no rales. She exhibits no tenderness.  Abdominal: Soft. Bowel sounds are normal. There is no tenderness. There is no rebound and no guarding.  Musculoskeletal: Normal range of motion. She exhibits no edema.  Patient has some mild tenderness to her  right lumbar region in the musculature.  There is no spinal tenderness.  Patellar reflex is symmetric bilaterally.  She has normal sensation and motor function to lower extremities.  Negative straight leg raise bilaterally.  Lymphadenopathy:    She has no cervical adenopathy.  Neurological: She is alert and oriented to person, place, and time.  Skin: Skin is warm and dry. No rash noted.  Psychiatric: She has a normal mood and affect.     ED Treatments / Results  Labs (all labs ordered are listed, but only abnormal results are displayed) Labs Reviewed  URINALYSIS, ROUTINE W REFLEX MICROSCOPIC - Abnormal; Notable for the following components:      Result Value   Hgb urine dipstick TRACE (*)    All other components within normal limits  URINALYSIS, MICROSCOPIC (REFLEX) - Abnormal; Notable for the following components:   Bacteria, UA MANY (*)    All other components within normal limits  PREGNANCY, URINE    EKG None  Radiology No results found.  Procedures Procedures (including critical care time)  Medications Ordered in ED Medications - No data to display   Initial Impression / Assessment and Plan / ED Course  I have reviewed the triage vital signs and the nursing notes.  Pertinent labs & imaging results that were available during my care of the patient were reviewed by me and considered in my medical decision making (see chart for details).     Patient is a 22 year old female who presents with back pain.  The back pain is been going on about 5 months.  Her urinalysis looks like a dirty specimen.  She does not have any symptoms that would be more concerning for UTI or pyelonephritis.  She has no radicular symptoms.  No associated abdominal pain.  No neurologic deficits.  No signs of cauda equina.  This is likely muscular in nature.  She was discharged home in good condition.  She was given prescription for Naprosyn.  She was given a resource guide for possible outpatient  follow-up.  Return precautions were given.  Final Clinical Impressions(s) / ED Diagnoses   Final diagnoses:  Chronic right-sided low back pain without sciatica    ED Discharge Orders        Ordered    naproxen (NAPROSYN) 375 MG tablet  2 times daily     01/18/18 1315       Rolan Bucco, MD 01/18/18 1316

## 2018-11-27 ENCOUNTER — Encounter (HOSPITAL_BASED_OUTPATIENT_CLINIC_OR_DEPARTMENT_OTHER): Payer: Self-pay

## 2018-11-27 ENCOUNTER — Emergency Department (HOSPITAL_BASED_OUTPATIENT_CLINIC_OR_DEPARTMENT_OTHER)
Admission: EM | Admit: 2018-11-27 | Discharge: 2018-11-27 | Disposition: A | Discharge status: Home / Self Care | Payer: Medicaid Other | Attending: Emergency Medicine | Admitting: Emergency Medicine

## 2018-11-27 ENCOUNTER — Other Ambulatory Visit: Payer: Self-pay

## 2018-11-27 ENCOUNTER — Emergency Department (HOSPITAL_BASED_OUTPATIENT_CLINIC_OR_DEPARTMENT_OTHER): Payer: Medicaid Other

## 2018-11-27 DIAGNOSIS — Z79899 Other long term (current) drug therapy: Secondary | ICD-10-CM | POA: Insufficient documentation

## 2018-11-27 DIAGNOSIS — Z331 Pregnant state, incidental: Secondary | ICD-10-CM | POA: Diagnosis not present

## 2018-11-27 DIAGNOSIS — E876 Hypokalemia: Secondary | ICD-10-CM | POA: Diagnosis not present

## 2018-11-27 DIAGNOSIS — R102 Pelvic and perineal pain: Secondary | ICD-10-CM | POA: Insufficient documentation

## 2018-11-27 DIAGNOSIS — F17210 Nicotine dependence, cigarettes, uncomplicated: Secondary | ICD-10-CM | POA: Diagnosis not present

## 2018-11-27 DIAGNOSIS — O26899 Other specified pregnancy related conditions, unspecified trimester: Secondary | ICD-10-CM

## 2018-11-27 LAB — COMPREHENSIVE METABOLIC PANEL
ALT: 13 U/L (ref 0–44)
AST: 16 U/L (ref 15–41)
Albumin: 4.2 g/dL (ref 3.5–5.0)
Alkaline Phosphatase: 51 U/L (ref 38–126)
Anion gap: 6 (ref 5–15)
BUN: 15 mg/dL (ref 6–20)
CO2: 21 mmol/L — ABNORMAL LOW (ref 22–32)
Calcium: 8.7 mg/dL — ABNORMAL LOW (ref 8.9–10.3)
Chloride: 109 mmol/L (ref 98–111)
Creatinine, Ser: 0.58 mg/dL (ref 0.44–1.00)
GFR calc Af Amer: >60 mL/min (ref >60)
GFR calc non Af Amer: >60 mL/min (ref >60)
Glucose, Bld: 89 mg/dL (ref 70–99)
Potassium: 3.3 mmol/L — ABNORMAL LOW (ref 3.5–5.1)
Sodium: 136 mmol/L (ref 135–145)
Total Bilirubin: 0.9 mg/dL (ref 0.3–1.2)
Total Protein: 7 g/dL (ref 6.5–8.1)

## 2018-11-27 LAB — CBC WITH DIFFERENTIAL/PLATELET
Abs Immature Granulocytes: 0.02 10*3/uL (ref 0.00–0.07)
Basophils Absolute: 0 10*3/uL (ref 0.0–0.1)
Basophils Relative: 0 %
Eosinophils Absolute: 0 10*3/uL (ref 0.0–0.5)
Eosinophils Relative: 1 %
HCT: 39.7 % (ref 36.0–46.0)
Hemoglobin: 13.2 g/dL (ref 12.0–15.0)
Immature Granulocytes: 0 %
Lymphocytes Relative: 29 %
Lymphs Abs: 2.4 10*3/uL (ref 0.7–4.0)
MCH: 31.9 pg (ref 26.0–34.0)
MCHC: 33.2 g/dL (ref 30.0–36.0)
MCV: 95.9 fL (ref 80.0–100.0)
Monocytes Absolute: 0.6 10*3/uL (ref 0.1–1.0)
Monocytes Relative: 7 %
Neutro Abs: 5 10*3/uL (ref 1.7–7.7)
Neutrophils Relative %: 63 %
Platelets: 203 10*3/uL (ref 150–400)
RBC: 4.14 MIL/uL (ref 3.87–5.11)
RDW: 12.7 % (ref 11.5–15.5)
WBC: 8 10*3/uL (ref 4.0–10.5)
nRBC: 0 % (ref 0.0–0.2)

## 2018-11-27 LAB — WET PREP, GENITAL
Sperm: NONE SEEN
Trich, Wet Prep: NONE SEEN
Yeast Wet Prep HPF POC: NONE SEEN

## 2018-11-27 LAB — URINALYSIS, ROUTINE W REFLEX MICROSCOPIC
Bilirubin Urine: NEGATIVE
Glucose, UA: NEGATIVE mg/dL
Hgb urine dipstick: NEGATIVE
Ketones, ur: 15 mg/dL — AB
Leukocytes,Ua: NEGATIVE
Nitrite: NEGATIVE
Protein, ur: NEGATIVE mg/dL
Specific Gravity, Urine: >1.03 — ABNORMAL HIGH (ref 1.005–1.030)
pH: 6 (ref 5.0–8.0)

## 2018-11-27 LAB — HCG, QUANTITATIVE, PREGNANCY: hCG, Beta Chain, Quant, S: 3567 m[IU]/mL — ABNORMAL HIGH (ref <5)

## 2018-11-27 LAB — PREGNANCY, URINE: Preg Test, Ur: POSITIVE — AB

## 2018-11-27 NOTE — Discharge Instructions (Signed)
Follow up with OB for further care and repeat ultrasound. Return to the ER for worsening pain, fever, new or concerning symptoms.  Take a prenatal vitamin daily.

## 2018-11-27 NOTE — ED Provider Notes (Signed)
[facility] EMERGENCY DEPARTMENT Provider Note   CSN: [id number] Arrival date & time: 11/27/18  1526    History   Chief Complaint Chief Complaint  Patient presents with  . Abdominal Pain    HPI Michele Barber is a 23 y.o. female.     23 year old female presents with complaint of right lower quadrant abdominal pain onset last night, dull in nature, radiates to her right back, no pain at this time.  Associated with loose stools.  Denies nausea, vomiting, changes in bladder habits or vaginal discharge.  Menstrual cycle was early last month, urine pregnancy test positive today.  Patient is G3, P2.  Reports previous history of chlamydia which was treated.  No other complaints or concerns today.     History reviewed. No pertinent past medical history.  There are no active problems to display for this patient.   History reviewed. No pertinent surgical history.   OB History   No obstetric history on file.      Home Medications    Prior to Admission medications   Medication Sig Start Date End Date Taking? Authorizing Provider  dicyclomine (BENTYL) 20 MG tablet Take 1 tablet (20 mg total) by mouth every 8 (eight) hours as needed for spasms. 09/02/16   Ward, Layla MawKristen N, DO  metroNIDAZOLE (FLAGYL) 500 MG tablet Take 1 tablet (500 mg total) by mouth 2 (two) times daily. 02/18/17   Maxwell CaulLayden, Lindsey A, PA-C  naproxen (NAPROSYN) 375 MG tablet Take 1 tablet (375 mg total) by mouth 2 (two) times daily. 01/18/18   Rolan BuccoBelfi, Melanie, MD  nitrofurantoin, macrocrystal-monohydrate, (MACROBID) 100 MG capsule Take 1 capsule (100 mg total) by mouth 2 (two) times daily. X 7 days 01/21/15   Molpus, John, MD  ondansetron (ZOFRAN ODT) 4 MG disintegrating tablet Take 1 tablet (4 mg total) by mouth every 8 (eight) hours as needed for nausea or vomiting. 09/02/16   Ward, Layla MawKristen N, DO  Prenatal Vit-Fe Fumarate-FA (PRENATAL COMPLETE) 14-0.4 MG TABS Take 1 tablet by mouth daily. 01/21/15   Molpus, John,  MD    Family History No family history on file.  Social History Social History   Tobacco Use  . Smoking status: Current Every Day Smoker    Types: Cigarettes  . Smokeless tobacco: Never Used  Substance Use Topics  . Alcohol use: No  . Drug use: Never     Allergies   Patient has no known allergies.   Review of Systems Review of Systems  Constitutional: Negative for chills and fever.  Gastrointestinal: Positive for abdominal pain and diarrhea. Negative for blood in stool, constipation, nausea and vomiting.  Genitourinary: Negative for dysuria, frequency, urgency, vaginal bleeding and vaginal discharge.  Musculoskeletal: Positive for back pain.  Skin: Negative for rash and wound.  Allergic/Immunologic: Negative for immunocompromised state.  Neurological: Negative for weakness.  Hematological: Negative for adenopathy. Does not bruise/bleed easily.  Psychiatric/Behavioral: Negative for confusion.  All other systems reviewed and are negative.    Physical Exam Updated Vital Signs BP 114/68 (BP Location: Left Arm)   Pulse 83   Temp 98.3 F (36.8 C) (Oral)   Ht 5\' 3"  (1.6 m)   Wt 48.1 kg   LMP 10/19/2018 (Approximate)   SpO2 99%   BMI 18.78 kg/m   Physical Exam Vitals signs and nursing note reviewed.  Constitutional:      General: She is not in acute distress.    Appearance: She is well-developed. She is not diaphoretic.  HENT:  Head: Normocephalic and atraumatic.  Cardiovascular:     Rate and Rhythm: Normal rate and regular rhythm.     Heart sounds: Normal heart sounds.  Pulmonary:     Effort: Pulmonary effort is normal.     Breath sounds: Normal breath sounds.  Abdominal:     General: Abdomen is flat. Bowel sounds are normal.     Palpations: Abdomen is soft.     Tenderness: There is no abdominal tenderness. There is no right CVA tenderness or left CVA tenderness.  Genitourinary:    Vagina: Normal. No vaginal discharge, erythema, tenderness or bleeding.      Cervix: No cervical motion tenderness or discharge.     Uterus: Normal. Not enlarged and not tender.      Adnexa: Right adnexa normal and left adnexa normal.  Skin:    General: Skin is warm and dry.  Neurological:     Mental Status: She is alert and oriented to person, place, and time.  Psychiatric:        Behavior: Behavior normal.      ED Treatments / Results  Labs (all labs ordered are listed, but only abnormal results are displayed) Labs Reviewed  URINALYSIS, ROUTINE W REFLEX MICROSCOPIC - Abnormal; Notable for the following components:      Result Value   Specific Gravity, Urine >1.030 (*)    Ketones, ur 15 (*)    All other components within normal limits  PREGNANCY, URINE - Abnormal; Notable for the following components:   Preg Test, Ur POSITIVE (*)    All other components within normal limits  WET PREP, GENITAL  HCG, QUANTITATIVE, PREGNANCY  COMPREHENSIVE METABOLIC PANEL  CBC WITH DIFFERENTIAL/PLATELET  GC/CHLAMYDIA PROBE AMP ([place]) NOT AT [facility]    EKG None  Radiology No results found.  Procedures Procedures (including critical care time)  Medications Ordered in ED Medications - No data to display   Initial Impression / Assessment and Plan / ED Course  I have reviewed the triage vital signs and the nursing notes.  Pertinent labs & imaging results that were available during my care of the patient were reviewed by me and considered in my medical decision making (see chart for details).  Clinical Course as of Nov 26 1757  Thu Nov 27, 2018  1720 22yo female with RLQ pain onset last night, radiates to right lower back, reports nonbloody stools.  Denies vaginal bleeding or discharge, changes in bladder habits, nausea, vomiting, fever.  On exam patient's abdomen is soft and nontender, no pelvic tenderness or significant discharge.  Patient's pregnancy test was found to be positive today.  Differential diagnosis includes not limited to appendicitis,  ectopic pregnancy.  Review of lab work shows CBC with normal white count, CMP with mild hypokalemia with a potassium of 3.3.  Wet prep positive for clue cells, asymptomatic and will not treat at this time.  Urinalysis with small amount of ketones, no UTI.  Quantitative hCG of 3567.  Pending pelvic ultrasound plan is to discharge to follow-up for prenatal care.  Patient advised to take daily prenatal vitamin and increase dietary potassium.   [LM]    Clinical Course User Index [LM] Jeannie Fend, PA-C      Final Clinical Impressions(s) / ED Diagnoses   Final diagnoses:  None    ED Discharge Orders    None       Jeannie Fend, PA-C 11/27/18 1759    Arby Barrette, MD 12/03/18 1000

## 2018-11-27 NOTE — ED Triage Notes (Signed)
C/o abd pain, diarrhea day 2-NAD-steady gait

## 2018-11-28 LAB — GC/CHLAMYDIA PROBE AMP (~~LOC~~) NOT AT ARMC
Chlamydia: NEGATIVE
Neisseria Gonorrhea: NEGATIVE
